# Patient Record
Sex: Female | Born: 1990 | Race: White | Hispanic: No | Marital: Married | Smoking: Never smoker
Health system: Southern US, Community
[De-identification: ages and names within clinical notes are randomized; demographics above are authoritative.]

## PROBLEM LIST (undated history)

## (undated) DIAGNOSIS — F419 Anxiety disorder, unspecified: Secondary | ICD-10-CM

## (undated) HISTORY — DX: Anxiety disorder, unspecified: F41.9

---

## 2017-11-25 DIAGNOSIS — Z1389 Encounter for screening for other disorder: Secondary | ICD-10-CM | POA: Diagnosis not present

## 2017-12-15 HISTORY — PX: WISDOM TOOTH EXTRACTION: SHX21

## 2018-12-13 DIAGNOSIS — R82998 Other abnormal findings in urine: Secondary | ICD-10-CM | POA: Diagnosis not present

## 2018-12-13 DIAGNOSIS — Z0000 Encounter for general adult medical examination without abnormal findings: Secondary | ICD-10-CM | POA: Diagnosis not present

## 2018-12-17 DIAGNOSIS — Z0000 Encounter for general adult medical examination without abnormal findings: Secondary | ICD-10-CM | POA: Diagnosis not present

## 2018-12-17 DIAGNOSIS — Z1389 Encounter for screening for other disorder: Secondary | ICD-10-CM | POA: Diagnosis not present

## 2019-01-10 DIAGNOSIS — J111 Influenza due to unidentified influenza virus with other respiratory manifestations: Secondary | ICD-10-CM | POA: Diagnosis not present

## 2019-02-09 DIAGNOSIS — Z124 Encounter for screening for malignant neoplasm of cervix: Secondary | ICD-10-CM | POA: Diagnosis not present

## 2019-02-09 DIAGNOSIS — Z01419 Encounter for gynecological examination (general) (routine) without abnormal findings: Secondary | ICD-10-CM | POA: Diagnosis not present

## 2020-09-12 LAB — OB RESULTS CONSOLE GC/CHLAMYDIA
Chlamydia: NEGATIVE
Gonorrhea: NEGATIVE

## 2020-10-12 LAB — OB RESULTS CONSOLE HEPATITIS B SURFACE ANTIGEN: Hepatitis B Surface Ag: NEGATIVE

## 2020-10-12 LAB — OB RESULTS CONSOLE RUBELLA ANTIBODY, IGM: Rubella: IMMUNE

## 2020-10-12 LAB — OB RESULTS CONSOLE ABO/RH: RH Type: POSITIVE

## 2020-10-12 LAB — OB RESULTS CONSOLE RPR: RPR: NONREACTIVE

## 2020-10-12 LAB — OB RESULTS CONSOLE HIV ANTIBODY (ROUTINE TESTING): HIV: NONREACTIVE

## 2020-10-12 LAB — OB RESULTS CONSOLE ANTIBODY SCREEN: Antibody Screen: NEGATIVE

## 2020-12-12 ENCOUNTER — Other Ambulatory Visit: Payer: Self-pay | Admitting: Obstetrics and Gynecology

## 2020-12-12 DIAGNOSIS — Z363 Encounter for antenatal screening for malformations: Secondary | ICD-10-CM

## 2020-12-15 NOTE — L&D Delivery Note (Signed)
Delivery Note Lisa Mckay is a G1P0 at [redacted]w[redacted]d who had a spontaneous delivery at 2328, a viable female was delivered via ROA.  APGAR: 7, 9; weight 7lb12.9oz (3541g)  .    Admitted for induction of labor for gestational hypertension.  Induced with cytotec and then progressed spontaneously. Progressed normally. Received epidural for pain management. Pushed for 2 hours. Baby was delivered without difficulty. Tight nuchal cord reduced with delivery of shoulders.   Delayed cord clamping for 60 seconds. Delivery of placenta was spontaneous. Superficial bilateral vaginal lacerations extending up to bilateral labia majora, repaired with 4-0 vicryl. Excellent hemostasis appreciated.  Fundus firm. Estimated blood loss 250cc. Instrument and gauze counts were correct at the end of the procedure.   Placenta status: to L&D   Anesthesia:  epidural Episiotomy:  none Lacerations:  bilateral vaginal extending to labia majora Suture Repair: 4-0 vicryl Est. Blood Loss (mL):   Mom to postpartum.  Baby to Couplet care / Skin to Skin.  Charlett Nose 05/01/2021, 12:06 AM

## 2020-12-18 ENCOUNTER — Other Ambulatory Visit: Payer: Self-pay | Admitting: *Deleted

## 2020-12-18 ENCOUNTER — Other Ambulatory Visit: Payer: Self-pay

## 2020-12-18 ENCOUNTER — Ambulatory Visit: Payer: Commercial Managed Care - PPO | Attending: Obstetrics and Gynecology

## 2020-12-18 ENCOUNTER — Encounter: Payer: Self-pay | Admitting: *Deleted

## 2020-12-18 ENCOUNTER — Ambulatory Visit: Payer: Commercial Managed Care - PPO | Admitting: *Deleted

## 2020-12-18 VITALS — BP 119/74 | HR 105 | Ht 65.0 in

## 2020-12-18 DIAGNOSIS — Z362 Encounter for other antenatal screening follow-up: Secondary | ICD-10-CM

## 2020-12-18 DIAGNOSIS — O322XX0 Maternal care for transverse and oblique lie, not applicable or unspecified: Secondary | ICD-10-CM

## 2020-12-18 DIAGNOSIS — Z363 Encounter for antenatal screening for malformations: Secondary | ICD-10-CM | POA: Diagnosis not present

## 2020-12-18 DIAGNOSIS — Z3A20 20 weeks gestation of pregnancy: Secondary | ICD-10-CM | POA: Diagnosis not present

## 2021-01-15 ENCOUNTER — Ambulatory Visit: Payer: Commercial Managed Care - PPO | Attending: Obstetrics and Gynecology

## 2021-01-15 ENCOUNTER — Encounter: Payer: Self-pay | Admitting: *Deleted

## 2021-01-15 ENCOUNTER — Other Ambulatory Visit: Payer: Self-pay

## 2021-01-15 ENCOUNTER — Ambulatory Visit: Payer: Commercial Managed Care - PPO | Admitting: *Deleted

## 2021-01-15 VITALS — BP 135/87 | HR 102

## 2021-01-15 DIAGNOSIS — Z362 Encounter for other antenatal screening follow-up: Secondary | ICD-10-CM

## 2021-01-15 DIAGNOSIS — Z3A24 24 weeks gestation of pregnancy: Secondary | ICD-10-CM

## 2021-04-11 LAB — OB RESULTS CONSOLE GBS: GBS: NEGATIVE

## 2021-04-23 ENCOUNTER — Other Ambulatory Visit: Payer: Self-pay

## 2021-04-23 ENCOUNTER — Encounter (HOSPITAL_COMMUNITY): Payer: Self-pay | Admitting: Obstetrics and Gynecology

## 2021-04-23 ENCOUNTER — Inpatient Hospital Stay (HOSPITAL_COMMUNITY)
Admission: AD | Admit: 2021-04-23 | Discharge: 2021-04-23 | Disposition: A | Discharge status: Home / Self Care | Payer: Commercial Managed Care - PPO | Attending: Obstetrics and Gynecology | Admitting: Obstetrics and Gynecology

## 2021-04-23 DIAGNOSIS — O26893 Other specified pregnancy related conditions, third trimester: Secondary | ICD-10-CM

## 2021-04-23 DIAGNOSIS — Z7982 Long term (current) use of aspirin: Secondary | ICD-10-CM | POA: Diagnosis not present

## 2021-04-23 DIAGNOSIS — Z3A38 38 weeks gestation of pregnancy: Secondary | ICD-10-CM | POA: Insufficient documentation

## 2021-04-23 DIAGNOSIS — Z3483 Encounter for supervision of other normal pregnancy, third trimester: Secondary | ICD-10-CM | POA: Diagnosis not present

## 2021-04-23 DIAGNOSIS — N898 Other specified noninflammatory disorders of vagina: Secondary | ICD-10-CM | POA: Diagnosis not present

## 2021-04-23 DIAGNOSIS — Z0371 Encounter for suspected problem with amniotic cavity and membrane ruled out: Secondary | ICD-10-CM | POA: Diagnosis present

## 2021-04-23 LAB — WET PREP, GENITAL
Clue Cells Wet Prep HPF POC: NONE SEEN
Sperm: NONE SEEN
Trich, Wet Prep: NONE SEEN
Yeast Wet Prep HPF POC: NONE SEEN

## 2021-04-23 LAB — POCT FERN TEST: POCT Fern Test: NEGATIVE

## 2021-04-23 NOTE — Discharge Instructions (Signed)

## 2021-04-23 NOTE — MAU Note (Signed)
Presents with c/o LOF since 1030 this morning, fluid clear per report.  Denies VB.  Endorses +FM.

## 2021-04-23 NOTE — MAU Provider Note (Signed)
  History     CSN: [id number]  Arrival date and time: 04/23/21 1543   None     Chief Complaint  Patient presents with  . Rupture of Membranes   HPI Lisa Mckay is a 29yo G1 at 38.1wks who presents for eval of vag d/c and questionable leaking. Has noticed this just today; no bleeding, ctx, or pain. Her preg has been followed by [facility] and has been essentially unremarkable.  OB History    Gravida  1   Para      Term      Preterm      AB      Living        SAB      IAB      Ectopic      Multiple      Live Births              Past Medical History:  Diagnosis Date  . Anxiety     Past Surgical History:  Procedure Laterality Date  . WISDOM TOOTH EXTRACTION N/A 2019    Family History  Problem Relation Age of Onset  . Hypertension Father   . Learning disabilities Brother     Social History   Tobacco Use  . Smoking status: Never Smoker  . Smokeless tobacco: Never Used  Vaping Use  . Vaping Use: Never used  Substance Use Topics  . Alcohol use: Not Currently  . Drug use: Never    Allergies: No Known Allergies  Medications Prior to Admission  Medication Sig Dispense Refill Last Dose  . aspirin EC 81 MG tablet Take 81 mg by mouth daily. Swallow whole.   04/23/2021 at Unknown time  . Prenatal Vit-Fe Fumarate-FA (PRENATAL MULTIVITAMIN) TABS tablet Take 1 tablet by mouth daily at 12 noon.       Review of Systems No other pertinents other than what is listed in HPI Physical Exam   Blood pressure 117/81, pulse 100, temperature 98.3 F (36.8 C), temperature source Oral, resp. rate 20, height 5\' 5"  (1.651 m), weight 98.4 kg, last menstrual period 07/24/2020, SpO2 98 %.  Physical Exam Constitutional:      Appearance: Normal appearance.  HENT:     Head: Normocephalic.     Mouth/Throat:     Mouth: Mucous membranes are moist.  Cardiovascular:     Rate and Rhythm: Normal rate.  Pulmonary:     Effort: Pulmonary effort is normal.  Abdominal:      Comments: EFM 145-150s, +accels, no decels, Cat 1 No ctx  Genitourinary:    Comments: SSE: white vag d/c, neg pool Cx deferred Musculoskeletal:     Cervical back: Normal range of motion.  Neurological:     Mental Status: She is alert.    Fern: neg  Microscopic wet-mount exam shows negative for pathogens, normal epithelial cells, white blood cells.   MAU Course  Procedures  MDM SSE, NST, fern  Assessment and Plan  IUP@38 .1wks Vag d/c in preg  D/C home with labor/leaking/bleeding precautions Keep next sched visit at [facility] on 04/25/10 or come to MAU sooner prn  06/25/10 CNM 04/23/2021, 4:40 PM

## 2021-04-25 ENCOUNTER — Other Ambulatory Visit: Payer: Self-pay

## 2021-04-25 ENCOUNTER — Encounter (HOSPITAL_COMMUNITY): Payer: Self-pay | Admitting: Obstetrics & Gynecology

## 2021-04-25 ENCOUNTER — Inpatient Hospital Stay (HOSPITAL_COMMUNITY)
Admission: AD | Admit: 2021-04-25 | Discharge: 2021-04-25 | Disposition: A | Discharge status: Home / Self Care | Payer: Commercial Managed Care - PPO | Attending: Obstetrics & Gynecology | Admitting: Obstetrics & Gynecology

## 2021-04-25 DIAGNOSIS — O26893 Other specified pregnancy related conditions, third trimester: Secondary | ICD-10-CM | POA: Diagnosis not present

## 2021-04-25 DIAGNOSIS — Z7982 Long term (current) use of aspirin: Secondary | ICD-10-CM | POA: Diagnosis not present

## 2021-04-25 DIAGNOSIS — Z3A38 38 weeks gestation of pregnancy: Secondary | ICD-10-CM | POA: Insufficient documentation

## 2021-04-25 DIAGNOSIS — R030 Elevated blood-pressure reading, without diagnosis of hypertension: Secondary | ICD-10-CM | POA: Diagnosis not present

## 2021-04-25 DIAGNOSIS — O99891 Other specified diseases and conditions complicating pregnancy: Secondary | ICD-10-CM | POA: Diagnosis not present

## 2021-04-25 DIAGNOSIS — Z8249 Family history of ischemic heart disease and other diseases of the circulatory system: Secondary | ICD-10-CM | POA: Insufficient documentation

## 2021-04-25 LAB — URINALYSIS, ROUTINE W REFLEX MICROSCOPIC
Bilirubin Urine: NEGATIVE
Glucose, UA: NEGATIVE mg/dL
Ketones, ur: NEGATIVE mg/dL
Nitrite: NEGATIVE
Protein, ur: NEGATIVE mg/dL
Specific Gravity, Urine: 1.015 (ref 1.005–1.030)
pH: 6 (ref 5.0–8.0)

## 2021-04-25 LAB — COMPREHENSIVE METABOLIC PANEL
ALT: 22 U/L (ref 0–44)
AST: 24 U/L (ref 15–41)
Albumin: 2.4 g/dL — ABNORMAL LOW (ref 3.5–5.0)
Alkaline Phosphatase: 169 U/L — ABNORMAL HIGH (ref 38–126)
Anion gap: 8 (ref 5–15)
BUN: 7 mg/dL (ref 6–20)
CO2: 23 mmol/L (ref 22–32)
Calcium: 8.5 mg/dL — ABNORMAL LOW (ref 8.9–10.3)
Chloride: 103 mmol/L (ref 98–111)
Creatinine, Ser: 0.7 mg/dL (ref 0.44–1.00)
GFR, Estimated: >60 mL/min (ref >60)
Glucose, Bld: 80 mg/dL (ref 70–99)
Potassium: 4 mmol/L (ref 3.5–5.1)
Sodium: 134 mmol/L — ABNORMAL LOW (ref 135–145)
Total Bilirubin: 0.6 mg/dL (ref 0.3–1.2)
Total Protein: 6.1 g/dL — ABNORMAL LOW (ref 6.5–8.1)

## 2021-04-25 LAB — CBC
HCT: 39.9 % (ref 36.0–46.0)
Hemoglobin: 13.3 g/dL (ref 12.0–15.0)
MCH: 29.7 pg (ref 26.0–34.0)
MCHC: 33.3 g/dL (ref 30.0–36.0)
MCV: 89.1 fL (ref 80.0–100.0)
Platelets: 249 10*3/uL (ref 150–400)
RBC: 4.48 MIL/uL (ref 3.87–5.11)
RDW: 13.4 % (ref 11.5–15.5)
WBC: 11.6 10*3/uL — ABNORMAL HIGH (ref 4.0–10.5)
nRBC: 0 % (ref 0.0–0.2)

## 2021-04-25 LAB — PROTEIN / CREATININE RATIO, URINE
Creatinine, Urine: 92.32 mg/dL
Protein Creatinine Ratio: 0.11 mg/mg{Cre} (ref 0.00–0.15)
Total Protein, Urine: 10 mg/dL

## 2021-04-25 NOTE — Progress Notes (Signed)
OK to d/c EFM per Sharen Counter CNM. CNM also stopped cycling b/p earlier

## 2021-04-25 NOTE — MAU Note (Signed)
Was seen in office today and b/p was elevated. Was told to come to hospital for evaluation. Pt states she has had a hectic day. Today is first day of HTN. Denies any h/a, epigastric pain, visual changes. FT today in office. No pain

## 2021-04-25 NOTE — Progress Notes (Signed)
Written and verbal d/c instructions given and understanding voiced. Will call office in AM for B/P check tomorrow.

## 2021-04-25 NOTE — MAU Provider Note (Addendum)
History     CSN: [id number]  Arrival date and time: 04/25/21 2022   Event Date/Time   First Provider Initiated Contact with Patient 04/25/21 2054      Chief Complaint  Patient presents with  . Hypertension   HPI Lisa Mckay is a 30 y.o. G1P0 at [redacted]w[redacted]d who presents for evaluation of high blood pressure. Patient was in the office this afternoon & had 2 elevated BPs. Denies history of hypertension & this was first elevation during her pregnancy. Was told to come here for evaluation but was unable to get her before now. Denies headache, visual disturbance, or epigastric pain. Reports good fetal movement.   OB History    Gravida  1   Para      Term      Preterm      AB      Living        SAB      IAB      Ectopic      Multiple      Live Births              Past Medical History:  Diagnosis Date  . Anxiety     Past Surgical History:  Procedure Laterality Date  . WISDOM TOOTH EXTRACTION N/A 2019    Family History  Problem Relation Age of Onset  . Hypertension Father   . Learning disabilities Brother     Social History   Tobacco Use  . Smoking status: Never Smoker  . Smokeless tobacco: Never Used  Vaping Use  . Vaping Use: Never used  Substance Use Topics  . Alcohol use: Not Currently  . Drug use: Never    Allergies: No Known Allergies  Medications Prior to Admission  Medication Sig Dispense Refill Last Dose  . aspirin EC 81 MG tablet Take 81 mg by mouth daily. Swallow whole.     . Prenatal Vit-Fe Fumarate-FA (PRENATAL MULTIVITAMIN) TABS tablet Take 1 tablet by mouth daily at 12 noon.       Review of Systems  Eyes: Negative for visual disturbance.  Gastrointestinal: Negative.   Neurological: Negative for headaches.   Physical Exam   Blood pressure 129/84, pulse 96, temperature 98.1 F (36.7 C), resp. rate 18, height 5\' 5"  (1.651 m), weight 98.4 kg, last menstrual period 07/24/2020.  Physical Exam Vitals and nursing note reviewed.   Constitutional:      Appearance: Normal appearance.  HENT:     Head: Normocephalic and atraumatic.  Eyes:     General: No scleral icterus. Cardiovascular:     Rate and Rhythm: Normal rate and regular rhythm.     Heart sounds: Normal heart sounds.  Pulmonary:     Effort: Pulmonary effort is normal. No respiratory distress.  Skin:    General: Skin is warm and dry.  Neurological:     Mental Status: She is alert.  Psychiatric:        Mood and Affect: Mood normal.        Behavior: Behavior normal.     MAU Course  Procedures  MDM Preeclampsia labs ordered due to elevated BPs this afternoon per Care Everywhere.   Care turned over to [facility], NP 04/25/2021 8:56 PM   Results for orders placed or performed during the hospital encounter of 04/25/21 (from the past 24 hour(s))  Urinalysis, Routine w reflex microscopic Urine, Clean Catch     Status: Abnormal   Collection Time: 04/25/21  9:03 PM  Result Value Ref Range   Color, Urine YELLOW YELLOW   APPearance CLEAR CLEAR   Specific Gravity, Urine 1.015 1.005 - 1.030   pH 6.0 5.0 - 8.0   Glucose, UA NEGATIVE NEGATIVE mg/dL   Hgb urine dipstick LARGE (A) NEGATIVE   Bilirubin Urine NEGATIVE NEGATIVE   Ketones, ur NEGATIVE NEGATIVE mg/dL   Protein, ur NEGATIVE NEGATIVE mg/dL   Nitrite NEGATIVE NEGATIVE   Leukocytes,Ua TRACE (A) NEGATIVE   RBC / HPF 0-5 0 - 5 RBC/hpf   WBC, UA 0-5 0 - 5 WBC/hpf   Bacteria, UA RARE (A) NONE SEEN   Squamous Epithelial / LPF 0-5 0 - 5   Mucus PRESENT   Protein / creatinine ratio, urine     Status: None   Collection Time: 04/25/21  9:03 PM  Result Value Ref Range   Creatinine, Urine 92.32 mg/dL   Total Protein, Urine 10 mg/dL   Protein Creatinine Ratio 0.11 0.00 - 0.15 mg/mg[Cre]  CBC     Status: Abnormal   Collection Time: 04/25/21  9:24 PM  Result Value Ref Range   WBC 11.6 (H) 4.0 - 10.5 K/uL   RBC 4.48 3.87 - 5.11 MIL/uL   Hemoglobin 13.3 12.0 - 15.0 g/dL    HCT 60.1 09.3 - 23.5 %   MCV 89.1 80.0 - 100.0 fL   MCH 29.7 26.0 - 34.0 pg   MCHC 33.3 30.0 - 36.0 g/dL   RDW 57.3 22.0 - 25.4 %   Platelets 249 150 - 400 K/uL   nRBC 0.0 0.0 - 0.2 %  Comprehensive metabolic panel     Status: Abnormal   Collection Time: 04/25/21  9:24 PM  Result Value Ref Range   Sodium 134 (L) 135 - 145 mmol/L   Potassium 4.0 3.5 - 5.1 mmol/L   Chloride 103 98 - 111 mmol/L   CO2 23 22 - 32 mmol/L   Glucose, Bld 80 70 - 99 mg/dL   BUN 7 6 - 20 mg/dL   Creatinine, Ser 2.70 0.44 - 1.00 mg/dL   Calcium 8.5 (L) 8.9 - 10.3 mg/dL   Total Protein 6.1 (L) 6.5 - 8.1 g/dL   Albumin 2.4 (L) 3.5 - 5.0 g/dL   AST 24 15 - 41 U/L   ALT 22 0 - 44 U/L   Alkaline Phosphatase 169 (H) 38 - 126 U/L   Total Bilirubin 0.6 0.3 - 1.2 mg/dL   GFR, Estimated >62 >37 mL/min   Anion gap 8 5 - 15    MDM:  Pt normotensive in MAU, no s/sx of PEC, and PEC labwork wnl with P/C ratio of 0.11.  NST reactive today.  Pt to f/u in office tomorrow, 04/26/21, for repeat BP. Warning signs/signs of PEC/reasons to return to MAU reviewed.  Assessment and Plan   1. Elevated blood-pressure reading without diagnosis of hypertension   2. [redacted] weeks gestation of pregnancy     D/C home with PEC precautions Close outpatient F/U for BP check  Sharen Counter, CNM 10:55 PM   .

## 2021-04-29 ENCOUNTER — Inpatient Hospital Stay (HOSPITAL_COMMUNITY)
Admission: AD | Admit: 2021-04-29 | Discharge: 2021-05-02 | DRG: 807 | Disposition: A | Discharge status: Home / Self Care | Payer: Commercial Managed Care - PPO | Attending: Obstetrics and Gynecology | Admitting: Obstetrics and Gynecology

## 2021-04-29 ENCOUNTER — Other Ambulatory Visit: Payer: Self-pay

## 2021-04-29 ENCOUNTER — Encounter (HOSPITAL_COMMUNITY): Payer: Self-pay | Admitting: Obstetrics and Gynecology

## 2021-04-29 DIAGNOSIS — Z20822 Contact with and (suspected) exposure to covid-19: Secondary | ICD-10-CM | POA: Diagnosis present

## 2021-04-29 DIAGNOSIS — Z3A39 39 weeks gestation of pregnancy: Secondary | ICD-10-CM | POA: Diagnosis not present

## 2021-04-29 DIAGNOSIS — O134 Gestational [pregnancy-induced] hypertension without significant proteinuria, complicating childbirth: Secondary | ICD-10-CM | POA: Diagnosis present

## 2021-04-29 DIAGNOSIS — Z7982 Long term (current) use of aspirin: Secondary | ICD-10-CM | POA: Diagnosis not present

## 2021-04-29 DIAGNOSIS — Z3490 Encounter for supervision of normal pregnancy, unspecified, unspecified trimester: Secondary | ICD-10-CM

## 2021-04-29 LAB — CBC
HCT: 41 % (ref 36.0–46.0)
Hemoglobin: 14.1 g/dL (ref 12.0–15.0)
MCH: 30.5 pg (ref 26.0–34.0)
MCHC: 34.4 g/dL (ref 30.0–36.0)
MCV: 88.6 fL (ref 80.0–100.0)
Platelets: 238 10*3/uL (ref 150–400)
RBC: 4.63 MIL/uL (ref 3.87–5.11)
RDW: 13.7 % (ref 11.5–15.5)
WBC: 12.1 10*3/uL — ABNORMAL HIGH (ref 4.0–10.5)
nRBC: 0 % (ref 0.0–0.2)

## 2021-04-29 LAB — COMPREHENSIVE METABOLIC PANEL
ALT: 20 U/L (ref 0–44)
AST: 23 U/L (ref 15–41)
Albumin: 2.4 g/dL — ABNORMAL LOW (ref 3.5–5.0)
Alkaline Phosphatase: 180 U/L — ABNORMAL HIGH (ref 38–126)
Anion gap: 11 (ref 5–15)
BUN: 8 mg/dL (ref 6–20)
CO2: 20 mmol/L — ABNORMAL LOW (ref 22–32)
Calcium: 8.6 mg/dL — ABNORMAL LOW (ref 8.9–10.3)
Chloride: 106 mmol/L (ref 98–111)
Creatinine, Ser: 0.65 mg/dL (ref 0.44–1.00)
GFR, Estimated: >60 mL/min (ref >60)
Glucose, Bld: 96 mg/dL (ref 70–99)
Potassium: 4.2 mmol/L (ref 3.5–5.1)
Sodium: 137 mmol/L (ref 135–145)
Total Bilirubin: 0.6 mg/dL (ref 0.3–1.2)
Total Protein: 5.6 g/dL — ABNORMAL LOW (ref 6.5–8.1)

## 2021-04-29 LAB — RESP PANEL BY RT-PCR (FLU A&B, COVID) ARPGX2
Influenza A by PCR: NEGATIVE
Influenza B by PCR: NEGATIVE
SARS Coronavirus 2 by RT PCR: NEGATIVE

## 2021-04-29 LAB — TYPE AND SCREEN
ABO/RH(D): A POS
Antibody Screen: NEGATIVE

## 2021-04-29 MED ORDER — ONDANSETRON HCL 4 MG/2ML IJ SOLN
4.0000 mg | Freq: Four times a day (QID) | INTRAMUSCULAR | Status: DC | PRN
  Administered 2021-04-30: 4 mg via INTRAVENOUS
  Filled 2021-04-29: qty 2

## 2021-04-29 MED ORDER — OXYCODONE-ACETAMINOPHEN 5-325 MG PO TABS: 2.0000 | ORAL_TABLET | ORAL | Status: DC | PRN

## 2021-04-29 MED ORDER — OXYTOCIN BOLUS FROM INFUSION
333.0000 mL | Freq: Once | INTRAVENOUS | Status: AC
  Administered 2021-04-30: 333 mL via INTRAVENOUS

## 2021-04-29 MED ORDER — SOD CITRATE-CITRIC ACID 500-334 MG/5ML PO SOLN: 30.0000 mL | ORAL | Status: DC | PRN

## 2021-04-29 MED ORDER — MISOPROSTOL 25 MCG QUARTER TABLET
25.0000 ug | ORAL_TABLET | ORAL | Status: DC | PRN
  Administered 2021-04-29 – 2021-04-30 (×3): 25 ug via VAGINAL
  Filled 2021-04-29 (×4): qty 1

## 2021-04-29 MED ORDER — LACTATED RINGERS IV SOLN: INTRAVENOUS | Status: DC

## 2021-04-29 MED ORDER — LIDOCAINE HCL (PF) 1 % IJ SOLN: 30.0000 mL | INTRAMUSCULAR | Status: DC | PRN

## 2021-04-29 MED ORDER — ACETAMINOPHEN 325 MG PO TABS
650.0000 mg | ORAL_TABLET | ORAL | Status: DC | PRN
  Administered 2021-04-30: 650 mg via ORAL
  Filled 2021-04-29: qty 2

## 2021-04-29 MED ORDER — OXYCODONE-ACETAMINOPHEN 5-325 MG PO TABS
1.0000 | ORAL_TABLET | ORAL | Status: DC | PRN
Start: 2021-04-29 — End: 2021-05-01

## 2021-04-29 MED ORDER — TERBUTALINE SULFATE 1 MG/ML IJ SOLN: 0.2500 mg | Freq: Once | INTRAMUSCULAR | Status: DC | PRN

## 2021-04-29 MED ORDER — LACTATED RINGERS IV SOLN
500.0000 mL | INTRAVENOUS | Status: DC | PRN
  Administered 2021-04-30: 500 mL via INTRAVENOUS

## 2021-04-29 MED ORDER — FENTANYL CITRATE (PF) 100 MCG/2ML IJ SOLN: 50.0000 ug | INTRAMUSCULAR | Status: DC | PRN

## 2021-04-29 MED ORDER — OXYTOCIN-SODIUM CHLORIDE 30-0.9 UT/500ML-% IV SOLN: 2.5000 [IU]/h | INTRAVENOUS | Status: DC

## 2021-04-29 NOTE — H&P (Signed)
30 y.o. [redacted]w[redacted]d  G1P0 comes in from office for direct admission for elevated blood pressure meeting criteria for GHTN.  She denies HA, vision change, RUQ or epigastric pain.  Otherwise has good fetal movement and no bleeding.  Past Medical History:  Diagnosis Date  . Anxiety     Past Surgical History:  Procedure Laterality Date  . WISDOM TOOTH EXTRACTION N/A 2019    OB History  Gravida Para Term Preterm AB Living  1            SAB IAB Ectopic Multiple Live Births               # Outcome Date GA Lbr Len/2nd Weight Sex Delivery Anes PTL Lv  1 Current             Social History   Socioeconomic History  . Marital status: Married    Spouse name: Not on file  . Number of children: Not on file  . Years of education: Not on file  . Highest education level: Not on file  Occupational History  . Not on file  Tobacco Use  . Smoking status: Never Smoker  . Smokeless tobacco: Never Used  Vaping Use  . Vaping Use: Never used  Substance and Sexual Activity  . Alcohol use: Not Currently  . Drug use: Never  . Sexual activity: Yes  Other Topics Concern  . Not on file  Social History Narrative  . Not on file   Social Determinants of Health   Financial Resource Strain: Not on file  Food Insecurity: Not on file  Transportation Needs: Not on file  Physical Activity: Not on file  Stress: Not on file  Social Connections: Not on file  Intimate Partner Violence: Not on file   Patient has no known allergies.    Prenatal Transfer Tool  Maternal Diabetes: No Genetic Screening: Normal Maternal Ultrasounds/Referrals: Normal Fetal Ultrasounds or other Referrals:  None Maternal Substance Abuse:  No Significant Maternal Medications:  Meds include: Other: aspirin 81mg  daily Significant Maternal Lab Results: Group B Strep negative  Other PNC: uncomplicated.    Vitals:   04/29/21 1804 04/29/21 1838 04/29/21 1931 04/29/21 2001  BP: 127/72 116/79 114/65 (!) 105/54  Pulse: 100 (!) 101 91  92  Resp: 18 16 16 18   Temp:  99.1 F (37.3 C)    TempSrc:  Oral    Weight:      Height:        Lungs/Cor:  NAD Abdomen:  soft, gravid Ex:  no cords, erythema SVE:  0.5/thichk/-3 FHTs:  150, good STV, NST R; Cat 1 tracing. Toco:  q q2-4   A/P   Admitted at term for IOL d/t GHTN PEC labs wnl BPs normal since arriva cytotec for cervical ripening, anticipate AROM pitocin  GBS Neg Other routine care.  05/01/21

## 2021-04-30 ENCOUNTER — Inpatient Hospital Stay (HOSPITAL_COMMUNITY): Payer: Commercial Managed Care - PPO | Admitting: Anesthesiology

## 2021-04-30 LAB — RPR: RPR Ser Ql: NONREACTIVE

## 2021-04-30 MED ORDER — EPHEDRINE 5 MG/ML INJ: 10.0000 mg | INTRAVENOUS | Status: DC | PRN

## 2021-04-30 MED ORDER — PHENYLEPHRINE 40 MCG/ML (10ML) SYRINGE FOR IV PUSH (FOR BLOOD PRESSURE SUPPORT)
80.0000 ug | PREFILLED_SYRINGE | INTRAVENOUS | Status: DC | PRN
  Administered 2021-04-30: 80 ug via INTRAVENOUS
  Filled 2021-04-30: qty 10

## 2021-04-30 MED ORDER — FENTANYL-BUPIVACAINE-NACL 0.5-0.125-0.9 MG/250ML-% EP SOLN
12.0000 mL/h | EPIDURAL | Status: DC | PRN
Start: 2021-04-30 — End: 2021-05-01
  Administered 2021-04-30: 12 mL/h via EPIDURAL
  Filled 2021-04-30: qty 250

## 2021-04-30 MED ORDER — OXYTOCIN-SODIUM CHLORIDE 30-0.9 UT/500ML-% IV SOLN
1.0000 m[IU]/min | INTRAVENOUS | Status: DC
  Administered 2021-04-30: 2 m[IU]/min via INTRAVENOUS
  Filled 2021-04-30: qty 500

## 2021-04-30 MED ORDER — LIDOCAINE-EPINEPHRINE (PF) 2 %-1:200000 IJ SOLN
INTRAMUSCULAR | Status: DC | PRN
  Administered 2021-04-30: 5 mL via EPIDURAL

## 2021-04-30 MED ORDER — DIPHENHYDRAMINE HCL 50 MG/ML IJ SOLN: 12.5000 mg | INTRAMUSCULAR | Status: DC | PRN

## 2021-04-30 MED ORDER — PHENYLEPHRINE 40 MCG/ML (10ML) SYRINGE FOR IV PUSH (FOR BLOOD PRESSURE SUPPORT): 80.0000 ug | PREFILLED_SYRINGE | INTRAVENOUS | Status: DC | PRN

## 2021-04-30 MED ORDER — LACTATED RINGERS IV SOLN
500.0000 mL | Freq: Once | INTRAVENOUS | Status: AC
  Administered 2021-04-30: 500 mL via INTRAVENOUS

## 2021-04-30 NOTE — Anesthesia Preprocedure Evaluation (Signed)
Anesthesia Evaluation  Patient identified by MRN, date of birth, ID band Patient awake    Reviewed: Allergy & Precautions, NPO status , Patient's Chart, lab work & pertinent test results  Airway Mallampati: III  TM Distance: >3 FB Neck ROM: Full    Dental no notable dental hx.    Pulmonary neg pulmonary ROS,    Pulmonary exam normal breath sounds clear to auscultation       Cardiovascular hypertension (gHTN), Normal cardiovascular exam Rhythm:Regular Rate:Normal     Neuro/Psych PSYCHIATRIC DISORDERS Anxiety negative neurological ROS     GI/Hepatic negative GI ROS, Neg liver ROS,   Endo/Other  negative endocrine ROS  Renal/GU negative Renal ROS  negative genitourinary   Musculoskeletal negative musculoskeletal ROS (+)   Abdominal   Peds  Hematology negative hematology ROS (+)   Anesthesia Other Findings IOL for gHTN  Reproductive/Obstetrics (+) Pregnancy                             Anesthesia Physical Anesthesia Plan  ASA: III  Anesthesia Plan: Epidural   Post-op Pain Management:    Induction:   PONV Risk Score and Plan: Treatment may vary due to age or medical condition  Airway Management Planned: Natural Airway  Additional Equipment:   Intra-op Plan:   Post-operative Plan:   Informed Consent: I have reviewed the patients History and Physical, chart, labs and discussed the procedure including the risks, benefits and alternatives for the proposed anesthesia with the patient or authorized representative who has indicated his/her understanding and acceptance.       Plan Discussed with: Anesthesiologist  Anesthesia Plan Comments: (Patient identified. Risks, benefits, options discussed with patient including but not limited to bleeding, infection, nerve damage, paralysis, failed block, incomplete pain control, headache, blood pressure changes, nausea, vomiting, reactions to  medication, itching, and post partum back pain. Confirmed with bedside nurse the patient's most recent platelet count. Confirmed with the patient that they are not taking any anticoagulation, have any bleeding history or any family history of bleeding disorders. Patient expressed understanding and wishes to proceed. All questions were answered. )        Anesthesia Quick Evaluation

## 2021-04-30 NOTE — Progress Notes (Addendum)
OB Progress Note  Notified by RN of run of recurrent late decels that resolved with position change to high fowlers and IV fluid bolus, as well as maternal T 100.19F.   S: Patient feeling mild intermittent rectal pressure when sitting up   O: Today's Vitals   04/30/21 1838 04/30/21 1901 04/30/21 1930 04/30/21 2000  BP: 113/61 117/65 117/66 120/62  Pulse: 98 89 95 88  Resp: 18 16 16 18   Temp: 99.3 F (37.4 C)   (!) 100.7 F (38.2 C)  TempSrc: Axillary   Oral  Weight:      Height:      PainSc: 0-No pain 0-No pain 0-No pain    Body mass index is 36.78 kg/m.  SVE 10/100/0  FHR: 155bpm, moderate variability, + accels, late decels, now resolved s/p resuscitative measures Toco: ctx q 2-3 min   A/P:29Y G1P0 @ [redacted]w[redacted]d, IOL for gestational HTN 1. Fetal well being: late decelerations resolved with resuscitate measures, now cat I tracing, overall reassuring 2. IOL: s/p cytotec x 3, pitocin started briefly but turned off earlier today due to decelerations. She has continued to contract spontaneously and is now fully dilated. Will allow short time for passive descent/fetal heart rate recovery and then start pushing. Anticipate SVD.  3. Gestational HTN: normotensive since admission, normal labs, asymptomatic 4. Pain control: epidural 5. Elevated maternal temp x 1: not meeting chorioamnionitis diagnostic criteria at this point, will continue to monitor closely. Low threshold to start antibiotics if persistent elevated temperature.   [redacted]w[redacted]d, MD 04/30/21 8:34 PM

## 2021-04-30 NOTE — Progress Notes (Signed)
OB Progress Note  S:Feeling mild cramping with contractions. No headache, vision changes, RUQ pain   O: Today's Vitals   04/30/21 0701 04/30/21 0710 04/30/21 0731 04/30/21 0748  BP: 125/75  (!) 113/57   Pulse: 86  81   Resp:      Temp:  98 F (36.7 C)    TempSrc:  Oral    Weight:      Height:      PainSc:    1    Body mass index is 36.78 kg/m.  SVE: 2/50/-2, SROM clear fluid with exam  FHR: 155bpm, moderate variability, + accels, small variable decels that resolve with position change/resuscitative measures Toco: ctx q 2-3 mins   A/P: 29Y G1P0 @ [redacted]w[redacted]d, IOL for gestational HTN 1. Fetal well being: cat 1-2 tracing, runs of small variables that resolve with position change/IV fluids, overall reassuring 2. IOL: s/p cytotec x 3, had planned foley balloon but SROM occurred with exam. Given regular contractions on toco as well as episode of cat 2 tracing, will discontinue cytotec and start low dose pitocin.  3. Gestational HTN: normotensive since admission, normal labs, asymptomatic 4. Pain control: epidural upon patient request  M. Timothy Lasso, MD 04/30/21 10:10 AM

## 2021-04-30 NOTE — Progress Notes (Signed)
OB Progress Note  Into room for run of deepening late/variable decelerations. Patient nauseas with low BP 97/62, 101/57, given phenylephrine just prior to my arrival in room.   S: Patient comfortable with epidural   O: Today's Vitals   04/30/21 1331 04/30/21 1401 04/30/21 1417 04/30/21 1418  BP: (!) 101/57 97/62 107/70 107/70  Pulse: 82 76 72 72  Resp:      Temp:      TempSrc:      Weight:      Height:      PainSc:       Body mass index is 36.78 kg/m.  SVE 4/80/-2, IUPC placed  FHR: 150bpm, moderate variability, + accels, late/variable decels Toco: ctx q 2-3 min, difficult to trace   A/P: 29Y G1P0 @ [redacted]w[redacted]d, IOL for gestational HTN 1. Fetal well being: variable/late decelerations resolved after phenylephrine administration/pitocin discontinued/left lateral position, now cat I tracing.  2. IOL: s/p cytotec x 3, was on pitocin 7mu/min. pitocin turned off due to decelerations. IUPC placed to better evaluate contractions and nature of decelerations, currently contracting regularly and making cervical change. Will consider restarting pit after 30 mins of resolved tracing, if needed for contraction augmentation.  - Discussed status with patient. Discussed FHR is responding to resuscitative measures and okay to proceed. If there is a point when the FHR is not responding to resuscitative measures, would consider cesarean. Patient states understanding.  3. Gestational HTN: normotensive since admission, normal labs, asymptomatic 4. Pain control: epidural  M. Timothy Lasso, MD 04/30/21 2:35 PM

## 2021-04-30 NOTE — Anesthesia Procedure Notes (Signed)
Epidural Patient location during procedure: OB Start time: 04/30/2021 11:10 AM End time: 04/30/2021 11:25 AM  Staffing Anesthesiologist: Elmer Picker, MD Performed: anesthesiologist   Preanesthetic Checklist Completed: patient identified, IV checked, risks and benefits discussed, monitors and equipment checked, pre-op evaluation and timeout performed  Epidural Patient position: sitting Prep: DuraPrep and site prepped and draped Patient monitoring: continuous pulse ox, blood pressure, heart rate and cardiac monitor Approach: midline Location: L2-L3 Injection technique: LOR air  Needle:  Needle type: Tuohy  Needle gauge: 17 G Needle length: 9 cm Needle insertion depth: 7 cm Catheter type: closed end flexible Catheter size: 19 Gauge Catheter at skin depth: 12 cm Test dose: negative  Assessment Sensory level: T8 Events: blood not aspirated, injection not painful, no injection resistance, no paresthesia and negative IV test  Additional Notes Patient identified. Risks/Benefits/Options discussed with patient including but not limited to bleeding, infection, nerve damage, paralysis, failed block, incomplete pain control, headache, blood pressure changes, nausea, vomiting, reactions to medication both or allergic, itching and postpartum back pain. Confirmed with bedside nurse the patient's most recent platelet count. Confirmed with patient that they are not currently taking any anticoagulation, have any bleeding history or any family history of bleeding disorders. Patient expressed understanding and wished to proceed. All questions were answered. Sterile technique was used throughout the entire procedure. Please see nursing notes for vital signs. Test dose was given through epidural catheter and negative prior to continuing to dose epidural or start infusion. Warning signs of high block given to the patient including shortness of breath, tingling/numbness in hands, complete motor block,  or any concerning symptoms with instructions to call for help. Patient was given instructions on fall risk and not to get out of bed. All questions and concerns addressed with instructions to call with any issues or inadequate analgesia.    First attempt at L3/L4. LOR at 7cm. Catheter inserted without difficulty; however, there was resistance when bolusing the catheter. Catheter pulled back 1cm without change in resistance. Catheter removed without difficulty. Second attempt at L2/L3. LOR at 7cm. Catheter inserted and bolused without difficulty. Reason for block:procedure for pain

## 2021-05-01 ENCOUNTER — Encounter (HOSPITAL_COMMUNITY): Payer: Self-pay | Admitting: Obstetrics and Gynecology

## 2021-05-01 LAB — CBC
HCT: 36.8 % (ref 36.0–46.0)
HCT: 34.8 % — ABNORMAL LOW (ref 36.0–46.0)
Hemoglobin: 12.4 g/dL (ref 12.0–15.0)
Hemoglobin: 11.7 g/dL — ABNORMAL LOW (ref 12.0–15.0)
MCH: 30.1 pg (ref 26.0–34.0)
MCH: 30.2 pg (ref 26.0–34.0)
MCHC: 33.7 g/dL (ref 30.0–36.0)
MCHC: 33.6 g/dL (ref 30.0–36.0)
MCV: 89.3 fL (ref 80.0–100.0)
MCV: 89.7 fL (ref 80.0–100.0)
Platelets: 201 10*3/uL (ref 150–400)
Platelets: 185 10*3/uL (ref 150–400)
RBC: 4.12 MIL/uL (ref 3.87–5.11)
RBC: 3.88 MIL/uL (ref 3.87–5.11)
RDW: 13.6 % (ref 11.5–15.5)
RDW: 13.9 % (ref 11.5–15.5)
WBC: 17.9 10*3/uL — ABNORMAL HIGH (ref 4.0–10.5)
WBC: 15.3 10*3/uL — ABNORMAL HIGH (ref 4.0–10.5)
nRBC: 0 % (ref 0.0–0.2)
nRBC: 0 % (ref 0.0–0.2)

## 2021-05-01 MED ORDER — COCONUT OIL OIL: 1.0000 "application " | TOPICAL_OIL | Status: DC | PRN

## 2021-05-01 MED ORDER — OXYCODONE HCL 5 MG PO TABS: 5.0000 mg | ORAL_TABLET | ORAL | Status: DC | PRN

## 2021-05-01 MED ORDER — IBUPROFEN 600 MG PO TABS
600.0000 mg | ORAL_TABLET | Freq: Four times a day (QID) | ORAL | Status: DC
  Administered 2021-05-01 – 2021-05-02 (×6): 600 mg via ORAL
  Filled 2021-05-01 (×6): qty 1

## 2021-05-01 MED ORDER — DIBUCAINE (PERIANAL) 1 % EX OINT: 1.0000 "application " | TOPICAL_OINTMENT | CUTANEOUS | Status: DC | PRN

## 2021-05-01 MED ORDER — WITCH HAZEL-GLYCERIN EX PADS: 1.0000 "application " | MEDICATED_PAD | CUTANEOUS | Status: DC | PRN

## 2021-05-01 MED ORDER — OXYCODONE HCL 5 MG PO TABS: 10.0000 mg | ORAL_TABLET | ORAL | Status: DC | PRN

## 2021-05-01 MED ORDER — DOCUSATE SODIUM 100 MG PO CAPS
100.0000 mg | ORAL_CAPSULE | Freq: Two times a day (BID) | ORAL | Status: DC
  Administered 2021-05-02: 100 mg via ORAL
  Filled 2021-05-01: qty 1

## 2021-05-01 MED ORDER — ONDANSETRON HCL 4 MG/2ML IJ SOLN: 4.0000 mg | INTRAMUSCULAR | Status: DC | PRN

## 2021-05-01 MED ORDER — ACETAMINOPHEN 325 MG PO TABS
650.0000 mg | ORAL_TABLET | ORAL | Status: DC | PRN
  Administered 2021-05-01: 650 mg via ORAL
  Filled 2021-05-01: qty 2

## 2021-05-01 MED ORDER — ONDANSETRON HCL 4 MG PO TABS: 4.0000 mg | ORAL_TABLET | ORAL | Status: DC | PRN

## 2021-05-01 MED ORDER — DIPHENHYDRAMINE HCL 25 MG PO CAPS: 25.0000 mg | ORAL_CAPSULE | Freq: Four times a day (QID) | ORAL | Status: DC | PRN

## 2021-05-01 MED ORDER — MAGNESIUM HYDROXIDE 400 MG/5ML PO SUSP: 30.0000 mL | ORAL | Status: DC | PRN

## 2021-05-01 MED ORDER — SIMETHICONE 80 MG PO CHEW
80.0000 mg | CHEWABLE_TABLET | ORAL | Status: DC | PRN
  Filled 2021-05-01: qty 1

## 2021-05-01 MED ORDER — BENZOCAINE-MENTHOL 20-0.5 % EX AERO
1.0000 "application " | INHALATION_SPRAY | CUTANEOUS | Status: DC | PRN
  Filled 2021-05-01: qty 56

## 2021-05-01 MED ORDER — PRENATAL MULTIVITAMIN CH
1.0000 | ORAL_TABLET | Freq: Every day | ORAL | Status: DC
  Administered 2021-05-01 – 2021-05-02 (×2): 1 via ORAL
  Filled 2021-05-01 (×2): qty 1

## 2021-05-01 NOTE — Anesthesia Postprocedure Evaluation (Signed)
Anesthesia Post Note  Patient: Lisa Mckay  Procedure(s) Performed: AN AD HOC LABOR EPIDURAL     Patient location during evaluation: Mother Baby Anesthesia Type: Epidural Level of consciousness: awake Pain management: satisfactory to patient Vital Signs Assessment: post-procedure vital signs reviewed and stable Respiratory status: spontaneous breathing Cardiovascular status: stable Anesthetic complications: no   No complications documented.  Last Vitals:  Vitals:   05/01/21 0300 05/01/21 0647  BP:  123/75  Pulse:  67  Resp:  15  Temp: 37.1 C 37.1 C  SpO2:      Last Pain:  Vitals:   05/01/21 0738  TempSrc:   PainSc: 0-No pain   Pain Goal:                   Cephus Shelling

## 2021-05-01 NOTE — Social Work (Signed)
CSW received consult for hx of Anxiety.  CSW met with MOB to offer support and complete assessment.     CSW introduced self and role. CSW observed FOB in recliner performing skin to skin with infant. MOB declined to have CSW return to speak in private. CSW informed MOB of reason for consult and assessed current emotions. MOB reported she is doing "good." MOB stated she also had a smooth pregnancy. MOB disclosed she was diagnosed with anxiety in 2019 and that she experienced some general anxiety during pregnancy. MOB stated she was started on Lexapro in 2019, which she stopped taking during the pregnancy. MOB reported she found it to be helpful and she has not decided if she will restart the medication postpartum. MOB denies ever attending therapy and was receptive to resources provided. MOB identified FOB, their parents and siblings as supports. MOB denies any current SI or HI.   CSW provided education regarding the baby blues period versus PPD. CSW provided the New Mom Checklist and encouraged MOB to self evaluate and contact a medical professional if symptoms are noted at any time.  CSW provided review of Sudden Infant Death Syndrome (SIDS) precautions.  MOB reported she has a car seat and bassinet for infant. MOB identified [facility] for follow-up care and denies any barriers to care. MOB expressed no additional needs at this time.   CSW identifies no further need for intervention and no barriers to discharge at this time.  Darra Lis, [place] [employer] [phone]

## 2021-05-01 NOTE — Lactation Note (Signed)
This note was copied from a baby's chart. Lactation Consultation Note Baby 5 hrs old. Mom stated baby fed a couple of hours ago. Mom just got back from BR using steady w/RN. Mom stated she was really tired and needed to rest. LC asked mom if I could set up DEBP, mom stated yes. LC set up pump and gave mom shells w/instructions how to wear them.  Mom is flat, breast are heavy w/some edema. Mom has generalized edema. Asked mom to call for feeding.  Lactation brochure given.,  Patient Name: Lisa Mckay UMPNT'I Date: 05/01/2021 Reason for consult: Initial assessment;Primapara;Term Age:49 hours  Maternal Data    Feeding    LATCH Score Latch: Repeated attempts needed to sustain latch, nipple held in mouth throughout feeding, stimulation needed to elicit sucking reflex.  Audible Swallowing: None  Type of Nipple: Flat  Comfort (Breast/Nipple): Filling, red/small blisters or bruises, mild/mod discomfort (very heavy, full feeling,edema)  Hold (Positioning): Assistance needed to correctly position infant at breast and maintain latch.  LATCH Score: 5   Lactation Tools Discussed/Used Tools: Pump;Shells Breast pump type: Double-Electric Breast Pump Pump Education: Other (comment) (set up pump. mom wanted to sleep. didn't show her how to use it.) Reason for Pumping: flat  Interventions Interventions: Breast feeding basics reviewed;DEBP;Skin to skin;Breast massage;Hand express;Shells  Discharge [facility] Program: No  Consult Status Consult Status: Follow-up Date: 05/01/21 Follow-up type: In-patient    Charyl Dancer 05/01/2021, 4:46 AM

## 2021-05-01 NOTE — Lactation Note (Signed)
This note was copied from a baby's chart. Lactation Consultation Note Baby less than hr old at time of consult. Assisted baby to cradle position for latching. Mom's breast are very heavy and full feeling. Mom does have generalized edema. Nipples does has some thickness to them. Finger massage and stimulation softens and everts nipples well for baby tom latch and feel in her mouth. Baby latched, off and on at first, took a little bit to start suckling but finally did. Baby softened breast tissue some especially at nipple area.  Hand expression demonstrated colostrum noted. Mom happy. Will f/u on MBU.  Patient Name: Lisa Mckay Date: 05/01/2021 Reason for consult: L&D Initial assessment;Primapara;Term Age:20 hours  Maternal Data Has patient been taught Hand Expression?: Yes  Feeding    LATCH Score Latch: Grasps breast easily, tongue down, lips flanged, rhythmical sucking.  Audible Swallowing: None  Type of Nipple: Flat  Comfort (Breast/Nipple): Soft / non-tender  Hold (Positioning): Assistance needed to correctly position infant at breast and maintain latch.  LATCH Score: 6   Lactation Tools Discussed/Used    Interventions Interventions: Adjust position;Assisted with latch;Support pillows;Skin to skin;Position options;Breast massage  Discharge    Consult Status Consult Status: Follow-up Date: 05/01/21 Follow-up type: In-patient    Charyl Dancer 05/01/2021, 12:47 AM

## 2021-05-01 NOTE — Lactation Note (Signed)
This note was copied from a baby's chart. Lactation Consultation Note  Patient Name: Lisa Mckay JSHFW'Y Date: 05/01/2021 Reason for consult: Follow-up assessment;Mother's request;Difficult latch;Primapara;1st time breastfeeding Age:28 hours   LC returned to work on latching. Infant has thick labial attachment and high palate. With suck training, she improved with strength of her latch. LC used 20 NS with curve tip and infant latched took 1 ml and transferred with compression from the breasts for about 10 minutes.   Parents consented to use DBM and paced bottle fed infant 10 ml.  Parents provided with comparison sheet on breastfeeding supplementation vs volumes to supplement when infant not latching.  Mom's nipples are flat and she is using breast shells to extend nipples. Mom aware to remove breast shells when she is sleeping, pumping or nursing. Mom can pre pump 5-10 minutes before latching.   Plan 1. To feed based on cues 8-12xin 24 hr period no more than 4 hrs without an attempt. Mom to offer both breasts and use 20 NS to assist with latching primed with DBM with curve tip to initiate a suck. Mom to use breast compression and ensure lips are flanged out with cheeks and nose touching.        2. Mom to pace bottle feed EBM or DBM based on guide provided an hrs of age since birth. Mom to offer more if infant not able to sustain a good latch.         3. Mom to pump on dEBP q 3 hrs for 15 minutes.  LC reviewed feeding plan with RN Royden Purl. Maternal Data Has patient been taught Hand Expression?: Yes  Feeding Mother's Current Feeding Choice: Breast Milk and Donor Milk  LATCH Score Latch: Repeated attempts needed to sustain latch, nipple held in mouth throughout feeding, stimulation needed to elicit sucking reflex.  Audible Swallowing: A few with stimulation  Type of Nipple: Flat  Comfort (Breast/Nipple): Soft / non-tender  Hold (Positioning): Assistance needed to correctly  position infant at breast and maintain latch.  LATCH Score: 6   Lactation Tools Discussed/Used Tools: Pump;Flanges Flange Size: 24 Breast pump type: Double-Electric Breast Pump Pump Education: Setup, frequency, and cleaning;Milk Storage Reason for Pumping: increase stimulation Pumping frequency: every 3 hrs for 15 minutes  Interventions Interventions: Breast feeding basics reviewed;Support pillows;Education;Assisted with latch;Position options;Skin to skin;Expressed milk;Breast massage;Hand express;Breast compression;DEBP;Adjust position  Discharge Pump: Personal  Consult Status Consult Status: Follow-up Date: 05/02/21 Follow-up type: In-patient    Lisa Mckay  Lisa Mckay 05/01/2021, 10:21 PM

## 2021-05-01 NOTE — Progress Notes (Signed)
Patient is doing well.  She is ambulating, voiding, tolerating PO.  Pain control is good.  Lochia is appropriate  Vitals:   05/01/21 0155 05/01/21 0255 05/01/21 0300 05/01/21 0647  BP: 124/62 115/60  123/75  Pulse: (!) 108 96  67  Resp: 16 18  15   Temp: 99.1 F (37.3 C) 99.5 F (37.5 C) 98.7 F (37.1 C) 98.8 F (37.1 C)  TempSrc: Oral Oral Oral Oral  SpO2: 95%     Weight:      Height:        NAD Fundus firm Ext: 1+ edema bilateral, symmetric  Lab Results  Component Value Date   WBC 15.3 (H) 05/01/2021   HGB 11.7 (L) 05/01/2021   HCT 34.8 (L) 05/01/2021   MCV 89.7 05/01/2021   PLT 185 05/01/2021    --/--/A POS (05/16 1755)/RImmune  A/P 29 y.o. G1P1001 PPD#1 s/p SVD at 39w for GHTN. Routine care.   BPs normal, did not require treatment during labor.  Asymptomatic  Expect d/c tomorrow.    [facility]

## 2021-05-01 NOTE — Lactation Note (Addendum)
This note was copied from a baby's chart. Lactation Consultation Note  Patient Name: Girl Moncia Annas QZRAQ'T Date: 05/01/2021   Age:27 hours  LC went in to assist with latching after receiving call twice from front desk. On arrival met with RN,who worked with Mom and told me to check in for next feeding.    LC returned at 20:18 to assess how feeding going but infant get test done at that time. LC to alert RN Mom will need assistance with feeding and discuss use of DBM as option for supplementation.    In flow sheet only 1 stool charted and no urine/.  Maternal Data    Feeding    LATCH Score                    Lactation Tools Discussed/Used    Interventions    Discharge    Consult Status      Giovan Pinsky  Nicholson-Springer 05/01/2021, 5:14 PM

## 2021-05-02 NOTE — Lactation Note (Signed)
This note was copied from a baby's chart. Lactation Consultation Note  Patient Name: Lisa Mckay DGLOV'F Date: 05/02/2021 Reason for consult: Follow-up assessment;Mother's request;Difficult latch;Primapara;1st time breastfeeding;Term Age:28 hours   Infant small volumes with bottle feeding and short intermittent sucks at the breasts, according to parents and feeding chart. LC worked with suck training, given infant high palate and thick labial attachment. Parents bottle feeding infant prior to start of this visit stating infant only able to take 5-10 ml.   Mom pumping twice today able to collect 11 ml with last 2 pumping sessions. LC informed mother with use of NS barrier to milk supply, important continues pumping on personal electric pump (Spectra) q 3hrs for 15 minutes after latching once at home.   Following suck training, infant took 10 ml with slow flow nipple. LC then latched infant at the breast using 20 NS, 5 french feeding tube and syringe gave additional 20 ml. Infant with breast compression would do some intermittent sucks at the breast but would stop without continuous flow of formula. Dad offered additional 3 ml formula  for total 33 ml for this feeding.   LC recommended Mom call Pediatrician office to set up appointment to meet with [facility] tomorrow. LC will also sent email to our outpatient clinic to contact mother if further follow up is needed an unavailable at pediatric office.   Mom has breast shells to wear when she is not pumping, sleeping or nursing. Mom aware to clean parts in between use.   Plan 1. To feed based on cues 8-12x in24 hr period no more than 4 hrs without an attempt. Mom to offer both breasts and look for swallows if needed with aid of 20 NS.             2 Supplementation after each feeding done with 5 french with syringe or paced bottle feeding with slow flow nipple according to breastfeeding supplementation guide and hrs of age since birth. ( parents aware to  offer more if infant not latching at the breasts)          3. Mom to pump with DEBP q 3 hrs for .              All questions answered at the end of the visit.     Maternal Data    Feeding Mother's Current Feeding Choice: Breast Milk and Formula  LATCH Score Latch: Repeated attempts needed to sustain latch, nipple held in mouth throughout feeding, stimulation needed to elicit sucking reflex.  Audible Swallowing: Spontaneous and intermittent (when pushing formula, slow intermittent at breasts only)  Type of Nipple: Flat  Comfort (Breast/Nipple): Soft / non-tender  Hold (Positioning): Assistance needed to correctly position infant at breast and maintain latch.  LATCH Score: 7   Lactation Tools Discussed/Used Tools: 30F feeding tube / Syringe;Flanges;Shells;Pump Flange Size: 27 Breast pump type: Double-Electric Breast Pump Pump Education: Setup, frequency, and cleaning;Milk Storage Reason for Pumping: increase stimulation Pumping frequency: every 3 hrs for 15 minutes  Interventions Interventions: Breast feeding basics reviewed;Adjust position;Assisted with latch;Skin to skin;Position options;Education;Breast massage;Expressed milk;Breast compression;Hand express;Shells;DEBP;Support pillows  Discharge Discharge Education: Engorgement and breast care;Warning signs for feeding baby;Outpatient recommendation;Outpatient Epic message sent Pump: Personal WIC Program: No  Consult Status Consult Status: Follow-up Date: 05/03/21 Follow-up type: In-patient    Barby Colvard  Nicholson-Springer 05/02/2021, 12:08 PM

## 2021-05-02 NOTE — Discharge Summary (Signed)
Postpartum Discharge Summary  Patient Name: Lisa Mckay DOB: 09-14-1991 MRN: [id number]  Date of admission: 04/29/2021 Delivery date:04/30/2021  Delivering provider: Irene Pap E  Date of discharge: 05/02/2021  Admitting diagnosis: Pregnancy [Z34.90] Intrauterine pregnancy: [redacted]w[redacted]d    Secondary diagnosis:  Active Problems:   Pregnancy  Additional problems: GHTN    Discharge diagnosis: Term Pregnancy Delivered and Gestational Hypertension                                              Post partum procedures:none Augmentation: AROM, Pitocin and Cytotec Complications: None  Hospital course: Induction of Labor With Vaginal Delivery   30y.o. yo G1P1001 at 282w1das admitted to the hospital 04/29/2021 for induction of labor.  Indication for induction: Gestational hypertension.  Patient had an uncomplicated labor course as follows: Membrane Rupture Time/Date: 9:30 AM ,04/30/2021   Delivery Method:Vaginal, Spontaneous  Episiotomy: None  Lacerations:  Vaginal  Details of delivery can be found in separate delivery note.  Patient had a routine postpartum course. Patient is discharged home 05/02/21.  Newborn Data: Birth date:04/30/2021  Birth time:11:28 PM  Gender:Female  Living status:Living  Apgars:7 ,9  Weight:3541 g   Magnesium Sulfate received: No BMZ received: No Rhophylac:N/A MMR:N/A T-DaP:see office note Flu: N/A Transfusion:No  Physical exam  Vitals:   05/01/21 0647 05/01/21 1154 05/01/21 1708 05/02/21 0525  BP: 123/75 111/65 119/74 120/84  Pulse: 67 86 84 95  Resp: _0 Temp: 98.8 F (37.1 C) 99.2 F (37.3 C) 98.7 F (37.1 C) 98.1 F (36.7 C)  TempSrc: Oral Oral Oral Oral  SpO2:    97%  Weight:      Height:       General: alert, cooperative and no distress Lochia: appropriate Uterine  Fundus: firm DVT Evaluation: No evidence of DVT seen on physical exam. Labs: Lab Results  Component Value Date   WBC 15.3 (H) 05/01/2021   HGB 11.7 (L)  05/01/2021   HCT 34.8 (L) 05/01/2021   MCV 89.7 05/01/2021   PLT 185 05/01/2021   CMP Latest Ref Rng & Units 04/29/2021  Glucose 70 - 99 mg/dL 96  BUN 6 - 20 mg/dL 8  Creatinine 0.44 - 1.00 mg/dL 0.65  Sodium 135 - 145 mmol/L 137  Potassium 3.5 - 5.1 mmol/L 4.2  Chloride 98 - 111 mmol/L 106  CO2 22 - 32 mmol/L 20(L)  Calcium 8.9 - 10.3 mg/dL 8.6(L)  Total Protein 6.5 - 8.1 g/dL 5.6(L)  Total Bilirubin 0.3 - 1.2 mg/dL 0.6  Alkaline Phos 38 - 126 U/L 180(H)  AST 15 - 41 U/L 23  ALT 0 - 44 U/L 20   Edinburgh Score: Edinburgh Postnatal Depression Scale Screening Tool 05/01/2021  I have been able to laugh and see the funny side of things. 0  I have looked forward with enjoyment to things. 0  I have blamed myself unnecessarily when things went wrong. 1  I have been anxious or worried for no good reason. 1  I have felt scared or panicky for no good reason. 0  Things have been getting on top of me. 1  I have been so unhappy that I have had difficulty sleeping. 0  I have felt sad or miserable. 0  I have been so unhappy that I have been crying. 0  The thought of harming  myself has occurred to me. 0  Edinburgh Postnatal Depression Scale Total 3      After visit meds:  Allergies as of 05/02/2021   No Known Allergies     Medication List    STOP taking these medications   aspirin EC 81 MG tablet     TAKE these medications   prenatal multivitamin Tabs tablet Take 1 tablet by mouth daily at 12 noon.        Discharge home in stable condition Infant Feeding: Breast Infant Disposition:home with mother Discharge instruction: per After Visit Summary and Postpartum booklet. Activity: Advance as tolerated. Pelvic rest for 6 weeks.  Diet: routine diet Anticipated Birth Control: Unsure Postpartum Appointment:4 weeks Additional Postpartum F/U: Postpartum Depression checkup Future Appointments:No future appointments. Follow up Visit:  Follow-up Information    Rowland Lathe, MD Follow up in 4 week(s).   Specialty: Obstetrics and Gynecology Contact information: [address] [place] [address] [phone]                   05/02/2021 Allyn Kenner, DO

## 2021-10-02 IMAGING — US US MFM OB COMP +14 WKS
1 series · 13 of 28 positions shown · non-contrast
Comparison: none

[Series 1: us mfm ob comp +14 wks · 100 acquisitions, 13 frames shown]
[im 4/100]
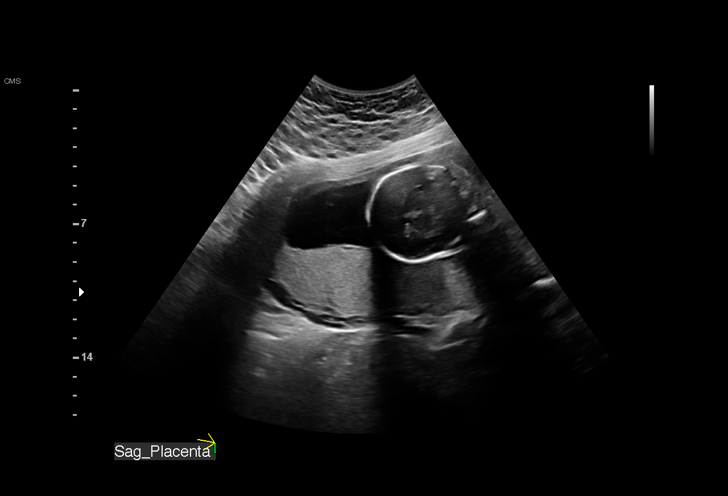
[im 12/100]
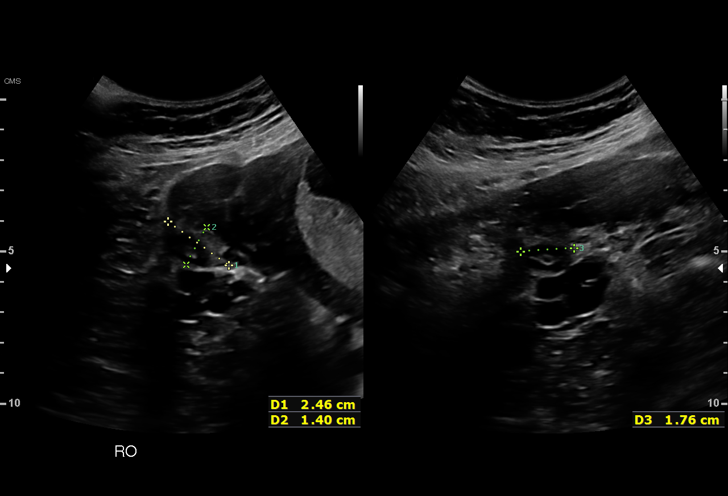
[im 19/100]
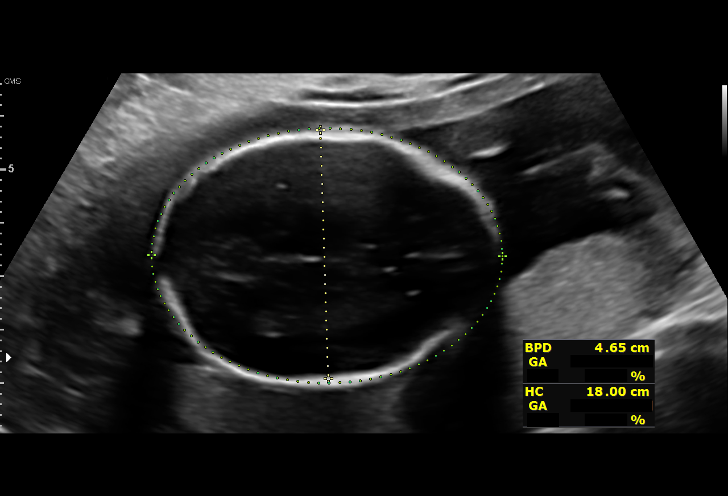
[im 26/100]
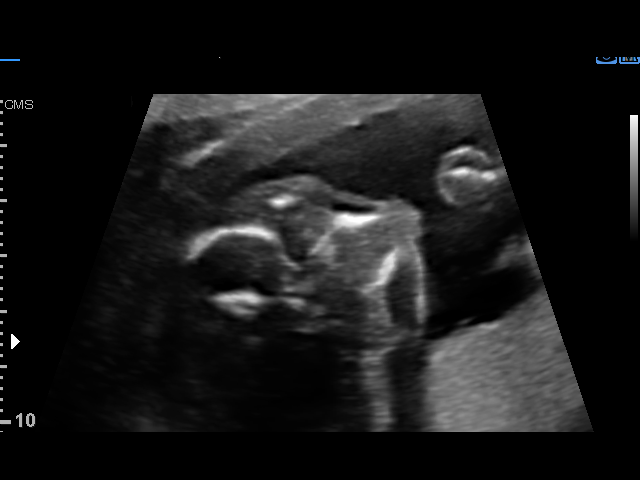
[im 34/100]
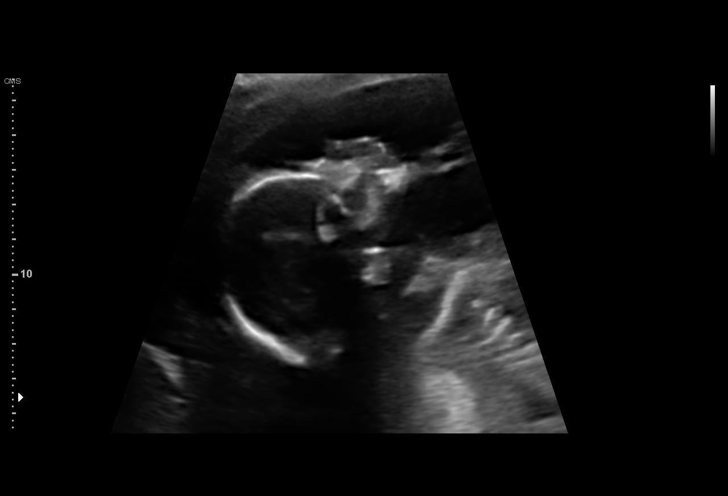
[im 41/100]
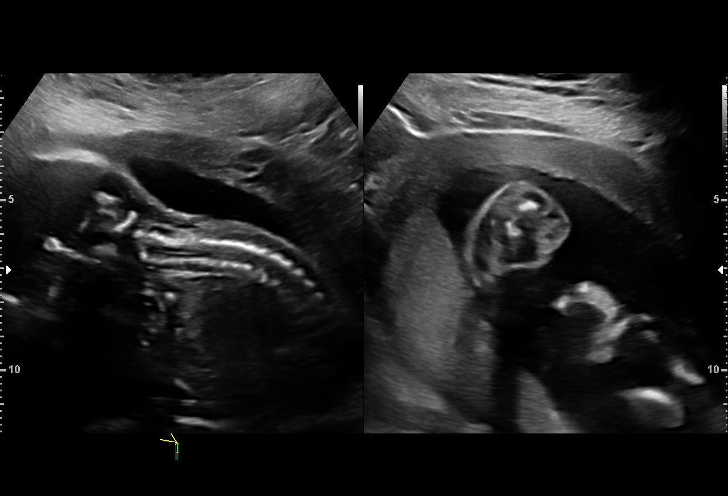
[im 52/100]
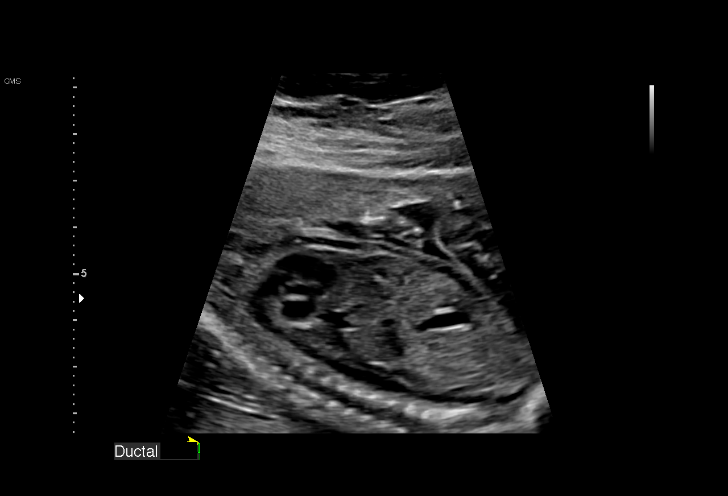
[im 59/100]
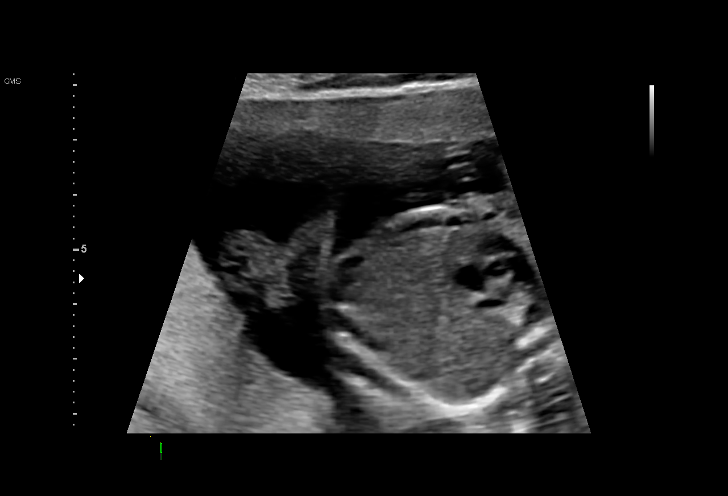
[im 67/100]
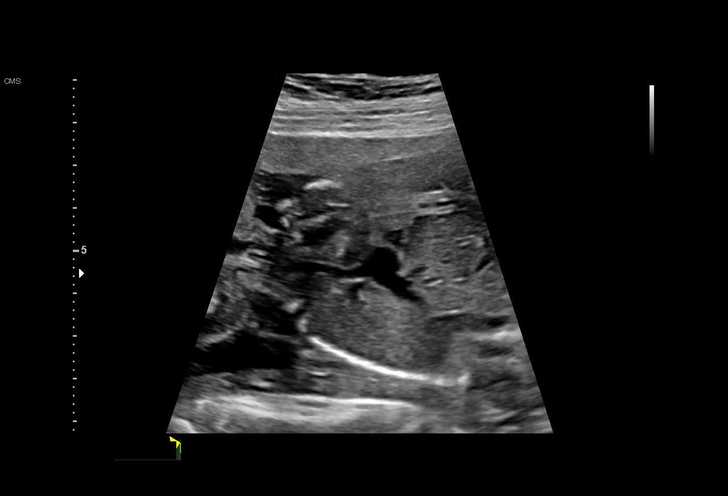
[im 74/100]
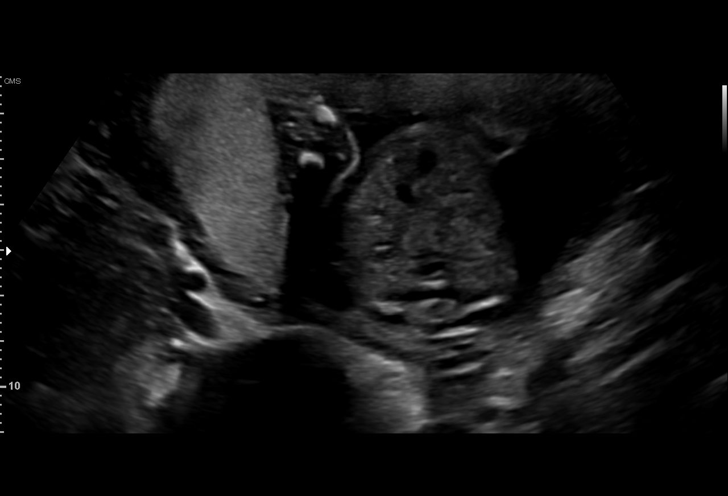
[im 81/100]
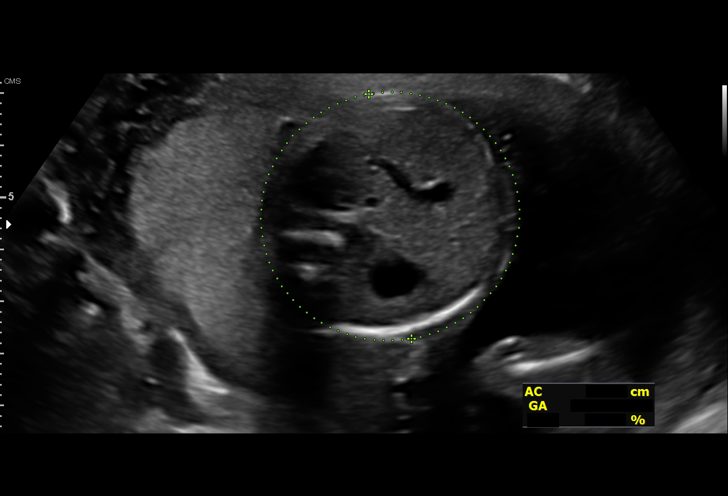
[im 89/100]
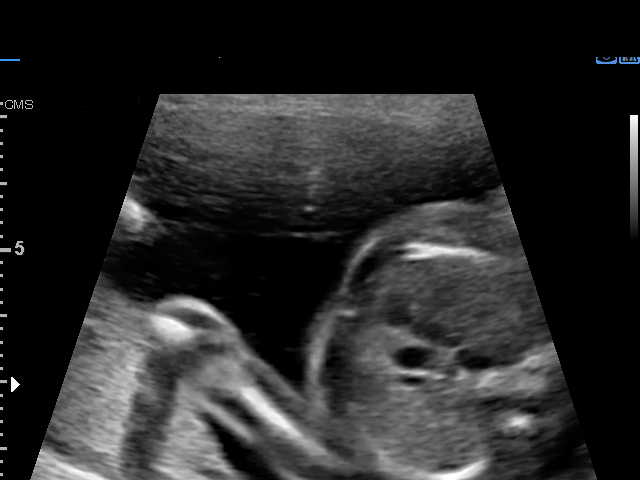
[im 96/100]
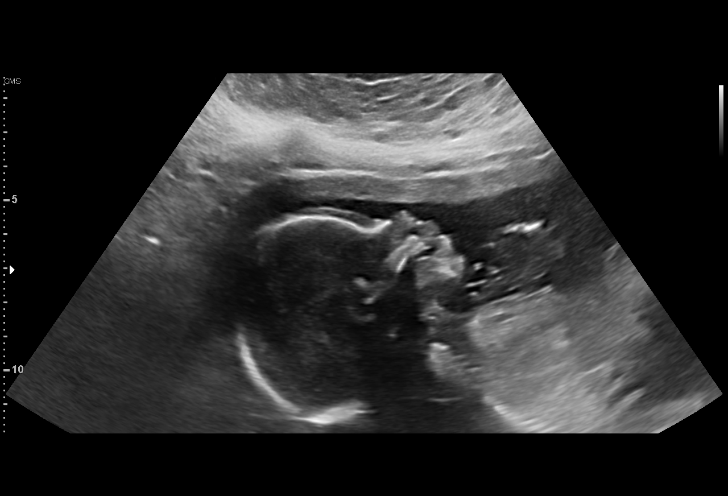

[13 of 28 positions shown; findings below may reference images not displayed]

BAMBUCAFE DO

 1  US MFM OB COMP + 14 WK                76805.01    SHAW NORMIL

Indications

 20 weeks gestation of pregnancy
 Antenatal screening for malformations
Fetal Evaluation

 Num Of Fetuses:         1
 Cardiac Activity:       Observed
 Presentation:           Breech
 Placenta:               Posterior
 P. Cord Insertion:      Visualized

 Amniotic Fluid
 AFI FV:      Within normal limits

                             Largest Pocket(cm)

Biometry

 BPD:      45.9  mm     G. Age:  19w 6d         38  %    CI:        68.15   %    70 - 86
                                                         FL/HC:      18.4   %    16.8 -
 HC:      177.8  mm     G. Age:  20w 2d         47  %    HC/AC:      1.17        1.09 -
 AC:      151.9  mm     G. Age:  20w 3d         53  %    FL/BPD:     71.2   %
 FL:       32.7  mm     G. Age:  20w 1d         45  %    FL/AC:      21.5   %    20 - 24
 HUM:      30.2  mm     G. Age:  20w 0d         47  %
 CER:      21.3  mm     G. Age:  20w 1d         73  %

 LV:        6.3  mm
 CM:          3  mm
 Est. FW:     345  gm    0 lb 12 oz      55  %
OB History

 Gravidity:    1         Term:   0        Prem:   0        SAB:   0
 TOP:          0       Ectopic:  0        Living: 0
Gestational Age

 LMP:           21w 5d        Date:  07/19/20                 EDD:   04/25/21
 U/S Today:     20w 1d                                        EDD:   05/06/21
 Best:          20w 1d     Det. By:  Previous Ultrasound      EDD:   05/06/21
Anatomy

 Cranium:               Appears normal         LVOT:                   Appears normal
 Cavum:                 Appears normal         Aortic Arch:            Appears normal
 Ventricles:            Appears normal         Ductal Arch:            Appears normal
 Choroid Plexus:        Appears normal         Diaphragm:              Appears normal
 Cerebellum:            Appears normal         Stomach:                Appears normal, left
                                                                       sided
 Posterior Fossa:       Appears normal         Abdomen:                Appears normal
 Nuchal Fold:           Appears normal         Abdominal Wall:         Appears nml (cord
                                                                       insert, abd wall)
 Face:                  Appears normal         Cord Vessels:           Appears normal (3
                        (orbits and profile)                           vessel cord)
 Lips:                  Appears normal         Kidneys:                Appear normal
 Palate:                Appears normal         Bladder:                Appears normal
 Thoracic:              Appears normal         Spine:                  Not well visualized
 Heart:                 Appears normal         Upper Extremities:      Appears normal
                        (4CH, axis, and
                        situs)
 RVOT:                  Appears normal         Lower Extremities:      Appears normal

 Other:  Heels/feet and open hands/5th digits visualized. Lenses visualized.
         Nasal bone visualized. Fetus appears to be female. Technically
         difficult due to fetal position.
Cervix Uterus Adnexa

 Cervix
 Length:           3.21  cm.
 Normal appearance by transabdominal scan.

 Uterus
 No abnormality visualized.

 Right Ovary
 Within normal limits.

 Left Ovary
 Within normal limits.

 Cul De Sac
 No free fluid seen.

 Adnexa
 No abnormality visualized.
Impression

 Single intrauterine pregnancy here for a detailed anatomy
 Normal anatomy with measurements consistent with dates
 There is good fetal movement and amniotic fluid volume
 Suboptimal views of the fetal anatomy were obtained
 secondary to fetal position.
 Low risk NIPS is reported.
Recommendations

 Follow up growth in 4-6 weeks.

## 2023-10-11 ENCOUNTER — Encounter: Payer: Self-pay | Admitting: Emergency Medicine

## 2023-10-11 ENCOUNTER — Other Ambulatory Visit: Payer: Self-pay

## 2023-10-11 ENCOUNTER — Emergency Department
Admission: EM | Admit: 2023-10-11 | Discharge: 2023-10-11 | Disposition: A | Discharge status: Home / Self Care | Payer: Commercial Managed Care - PPO | Attending: Emergency Medicine | Admitting: Emergency Medicine

## 2023-10-11 DIAGNOSIS — F419 Anxiety disorder, unspecified: Secondary | ICD-10-CM

## 2023-10-11 DIAGNOSIS — R079 Chest pain, unspecified: Secondary | ICD-10-CM | POA: Insufficient documentation

## 2023-10-11 DIAGNOSIS — R002 Palpitations: Secondary | ICD-10-CM | POA: Diagnosis present

## 2023-10-11 LAB — COMPREHENSIVE METABOLIC PANEL
ALT: 19 U/L (ref 0–44)
AST: 18 U/L (ref 15–41)
Albumin: 3.4 g/dL — ABNORMAL LOW (ref 3.5–5.0)
Alkaline Phosphatase: 47 U/L (ref 38–126)
Anion gap: 7 (ref 5–15)
BUN: 15 mg/dL (ref 6–20)
CO2: 23 mmol/L (ref 22–32)
Calcium: 8.5 mg/dL — ABNORMAL LOW (ref 8.9–10.3)
Chloride: 105 mmol/L (ref 98–111)
Creatinine, Ser: 0.75 mg/dL (ref 0.44–1.00)
GFR, Estimated: >60 mL/min (ref >60)
Glucose, Bld: 149 mg/dL — ABNORMAL HIGH (ref 70–99)
Potassium: 3.6 mmol/L (ref 3.5–5.1)
Sodium: 135 mmol/L (ref 135–145)
Total Bilirubin: 0.6 mg/dL (ref 0.3–1.2)
Total Protein: 7.1 g/dL (ref 6.5–8.1)

## 2023-10-11 LAB — CBC
HCT: 42 % (ref 36.0–46.0)
Hemoglobin: 14.8 g/dL (ref 12.0–15.0)
MCH: 31.2 pg (ref 26.0–34.0)
MCHC: 35.2 g/dL (ref 30.0–36.0)
MCV: 88.4 fL (ref 80.0–100.0)
Platelets: 227 10*3/uL (ref 150–400)
RBC: 4.75 MIL/uL (ref 3.87–5.11)
RDW: 11.7 % (ref 11.5–15.5)
WBC: 10.2 10*3/uL (ref 4.0–10.5)
nRBC: 0 % (ref 0.0–0.2)

## 2023-10-11 LAB — POC URINE PREG, ED: Preg Test, Ur: NEGATIVE

## 2023-10-11 LAB — TROPONIN I (HIGH SENSITIVITY): Troponin I (High Sensitivity): <2 ng/L (ref <18)

## 2023-10-11 MED ORDER — LORAZEPAM 1 MG PO TABS
1.0000 mg | ORAL_TABLET | Freq: Once | ORAL | Status: AC
  Administered 2023-10-11: 1 mg via ORAL
  Filled 2023-10-11: qty 1

## 2023-10-11 NOTE — ED Provider Notes (Signed)
[facility] Provider Note    Event Date/Time   First MD Initiated Contact with Patient 10/11/23 1111     (approximate)  History   Chief Complaint: Chest Pain  HPI  Lisa Mckay is a 32 y.o. female with a past medical history of anxiety who presents to the emergency department for heart palpitations and chest tightness.  According to the patient since yesterday she has been experiencing some palpitations.  States it was keeping her from sleeping some last night.  This morning she took an Adderall tablet which she does not take very often but was very tired today.  Patient states since taking the Adderall tablet she feels like her symptoms has worsened where she is experiencing a racing sensation in her chest as well as some intermittent chest tightness.  Patient describes as "mini panic attacks."  Patient has a history of anxiety and no longer takes any anxiety medication.  Patient states since coming to the emergency department the palpitations have calm down and she is not having any chest tightness or pain currently.  Patient does not wish for anything for anxiety at the moment.  Denies any cardiac history.  Physical Exam   Triage Vital Signs: ED Triage Vitals [10/11/23 1112]  Encounter Vitals Group     BP      Systolic BP Percentile      Diastolic BP Percentile      Pulse      Resp      Temp      Temp src      SpO2      Weight 220 lb 14.4 oz (100.2 kg)     Height 5\' 5"  (1.651 m)     Head Circumference      Peak Flow      Pain Score 1     Pain Loc      Pain Education      Exclude from Growth Chart     Most recent vital signs: There were no vitals filed for this visit.  General: Awake, no distress.  CV:  Good peripheral perfusion.  Regular rate and rhythm around 90 bpm. Resp:  Normal effort.  Equal breath sounds bilaterally.  Abd:  No distention.  Soft, nontender.  No rebound or guarding.  ED Results / Procedures / Treatments   EKG  EKG  viewed and interpreted by myself shows a normal sinus rhythm at 95 bpm with a narrow QRS, normal axis, normal intervals, no concerning ST changes.  MEDICATIONS ORDERED IN ED: Medications - No data to display   IMPRESSION / MDM / ASSESSMENT AND PLAN / ED COURSE  I reviewed the triage vital signs and the nursing notes.  Patient's presentation is most consistent with acute presentation with potential threat to life or bodily function.  Patient presents to the emergency department for palpitations/heart racing sensation started last night but worsened today after taking Adderall.  Patient states a history of anxiety and panic disorder to which this feels similar.  Patient states her symptoms have begun to calm down since arriving to the emergency department.  EKG reassuring.  Will obtain labs as a precaution and continue to closely monitor the patient.  Patient's labs are reassuring CBC is normal, chemistry is reassuring and troponin is negative.  Patient states she is feeling much better.  We will discharge the patient home with continued supportive care at home avoiding Adderall and following up with her PCP.  FINAL CLINICAL IMPRESSION(S) / ED  DIAGNOSES   Palpitations Anxiety    Note:  This document was prepared using Dragon voice recognition software and may include unintentional dictation errors.   Minna Antis, MD 10/11/23 1207

## 2023-10-11 NOTE — ED Triage Notes (Signed)
Presents via EMS from [employer] she developed some chest tightness since last pm   States became worse this am

## 2023-10-18 ENCOUNTER — Encounter (HOSPITAL_COMMUNITY): Payer: Self-pay

## 2023-10-18 ENCOUNTER — Other Ambulatory Visit: Payer: Self-pay

## 2023-10-18 ENCOUNTER — Emergency Department (HOSPITAL_COMMUNITY)
Admission: EM | Admit: 2023-10-18 | Discharge: 2023-10-18 | Disposition: A | Discharge status: Home / Self Care | Payer: Commercial Managed Care - PPO | Attending: Emergency Medicine | Admitting: Emergency Medicine

## 2023-10-18 ENCOUNTER — Emergency Department (HOSPITAL_COMMUNITY): Payer: Commercial Managed Care - PPO

## 2023-10-18 DIAGNOSIS — K219 Gastro-esophageal reflux disease without esophagitis: Secondary | ICD-10-CM | POA: Insufficient documentation

## 2023-10-18 DIAGNOSIS — R079 Chest pain, unspecified: Secondary | ICD-10-CM | POA: Diagnosis present

## 2023-10-18 LAB — LIPASE, BLOOD: Lipase: 38 U/L (ref 11–51)

## 2023-10-18 LAB — HCG, SERUM, QUALITATIVE: Preg, Serum: NEGATIVE

## 2023-10-18 LAB — CBC
HCT: 45.5 % (ref 36.0–46.0)
Hemoglobin: 15.9 g/dL — ABNORMAL HIGH (ref 12.0–15.0)
MCH: 31.1 pg (ref 26.0–34.0)
MCHC: 34.9 g/dL (ref 30.0–36.0)
MCV: 88.9 fL (ref 80.0–100.0)
Platelets: 275 10*3/uL (ref 150–400)
RBC: 5.12 MIL/uL — ABNORMAL HIGH (ref 3.87–5.11)
RDW: 11.7 % (ref 11.5–15.5)
WBC: 7.4 10*3/uL (ref 4.0–10.5)
nRBC: 0 % (ref 0.0–0.2)

## 2023-10-18 LAB — URINALYSIS, ROUTINE W REFLEX MICROSCOPIC
Bilirubin Urine: NEGATIVE
Glucose, UA: NEGATIVE mg/dL
Ketones, ur: NEGATIVE mg/dL
Nitrite: NEGATIVE
Protein, ur: NEGATIVE mg/dL
Specific Gravity, Urine: 1.019 (ref 1.005–1.030)
pH: 6 (ref 5.0–8.0)

## 2023-10-18 LAB — COMPREHENSIVE METABOLIC PANEL
ALT: 27 U/L (ref 0–44)
AST: 21 U/L (ref 15–41)
Albumin: 3.5 g/dL (ref 3.5–5.0)
Alkaline Phosphatase: 54 U/L (ref 38–126)
Anion gap: 9 (ref 5–15)
BUN: 7 mg/dL (ref 6–20)
CO2: 27 mmol/L (ref 22–32)
Calcium: 9.5 mg/dL (ref 8.9–10.3)
Chloride: 102 mmol/L (ref 98–111)
Creatinine, Ser: 0.79 mg/dL (ref 0.44–1.00)
GFR, Estimated: >60 mL/min (ref >60)
Glucose, Bld: 137 mg/dL — ABNORMAL HIGH (ref 70–99)
Potassium: 3.7 mmol/L (ref 3.5–5.1)
Sodium: 138 mmol/L (ref 135–145)
Total Bilirubin: 0.6 mg/dL (ref 0.3–1.2)
Total Protein: 7.3 g/dL (ref 6.5–8.1)

## 2023-10-18 MED ORDER — FAMOTIDINE 20 MG PO TABS: 20.0000 mg | ORAL_TABLET | Freq: Two times a day (BID) | ORAL | 0 refills | Status: AC

## 2023-10-18 MED ORDER — LIDOCAINE VISCOUS HCL 2 % MT SOLN
15.0000 mL | Freq: Once | OROMUCOSAL | Status: AC
  Administered 2023-10-18: 15 mL via ORAL
  Filled 2023-10-18: qty 15

## 2023-10-18 MED ORDER — ALUM & MAG HYDROXIDE-SIMETH 200-200-20 MG/5ML PO SUSP
30.0000 mL | Freq: Once | ORAL | Status: AC
  Administered 2023-10-18: 30 mL via ORAL
  Filled 2023-10-18: qty 30

## 2023-10-18 NOTE — ED Notes (Signed)
Patient verbalizes understanding of discharge instructions. Opportunity for questioning and answers were provided. Armband removed by staff, pt discharged from ED. Ambulated out to lobby with husband

## 2023-10-18 NOTE — Discharge Instructions (Addendum)
I am suspicious that your chest pain is likely secondary to acid reflux, I started you on an antacid, to help with this, please take it twice a day, for this.  Please make the changes of your food choices, accordingly.  Return to the ER if you have worsening chest pain, shortness of breath, or abdominal pain.

## 2023-10-18 NOTE — ED Triage Notes (Signed)
PT arrived POV from home c/o nausea and vomiting on and off all week. Pt denies any belly pain but cannot take the N/V anymore and did not want to spend another night next to the toilet.

## 2023-10-18 NOTE — ED Provider Notes (Signed)
[place] EMERGENCY DEPARTMENT AT [facility] Provider Note   CSN: [id number] Arrival date & time: 10/18/23  0113     History  Chief Complaint  Patient presents with   Nausea    Lisa Mckay is a 32 y.o. female, no pertinent past medical history, who presents to the ED secondary to chest pain, has been going on for the last week.  She states that it is worse with eating, and it feels like burning in her chest, with associated nausea and regurgitation.  She denies any kind of abdominal pain, diarrhea, has been having normal bowel movements.  States that she has not tried anything for this.  Denies any shortness of breath  Drinks socially.      Home Medications Prior to Admission medications   Medication Sig Start Date End Date Taking? Authorizing Provider  escitalopram (LEXAPRO) 10 MG tablet Take 10 mg by mouth daily. 10/12/23  Yes [provider]  famotidine (PEPCID) 20 MG tablet Take 1 tablet (20 mg total) by mouth 2 (two) times daily. 10/18/23  Yes Yazmyne Sara L, PA  SRONYX 0.1-20 MG-MCG tablet Take 1 tablet by mouth daily. 09/19/23  Yes [provider]      Allergies    Patient has no known allergies.    Review of Systems   Review of Systems  Cardiovascular:  Positive for chest pain.  Gastrointestinal:  Negative for abdominal pain and constipation.    Physical Exam Updated Vital Signs BP 118/68   Pulse 72   Temp 98.4 F (36.9 C) (Oral)   Resp 13   Ht 5\' 5"  (1.651 m)   Wt 77.1 kg   LMP 09/27/2023 (Approximate)   SpO2 99%   BMI 28.29 kg/m  Physical Exam Vitals and nursing note reviewed.  Constitutional:      General: She is not in acute distress.    Appearance: She is well-developed.  HENT:     Head: Normocephalic and atraumatic.  Eyes:     Conjunctiva/sclera: Conjunctivae normal.  Cardiovascular:     Rate and Rhythm: Normal rate and regular rhythm.     Heart sounds: No murmur heard. Pulmonary:     Effort: Pulmonary effort  is normal. No respiratory distress.     Breath sounds: Normal breath sounds.  Abdominal:     Palpations: Abdomen is soft.     Tenderness: There is no abdominal tenderness.  Musculoskeletal:        General: No swelling.     Cervical back: Neck supple.  Skin:    General: Skin is warm and dry.     Capillary Refill: Capillary refill takes less than 2 seconds.  Neurological:     Mental Status: She is alert.  Psychiatric:        Mood and Affect: Mood normal.     ED Results / Procedures / Treatments   Labs (all labs ordered are listed, but only abnormal results are displayed) Labs Reviewed  COMPREHENSIVE METABOLIC PANEL - Abnormal; Notable for the following components:      Result Value   Glucose, Bld 137 (*)    All other components within normal limits  CBC - Abnormal; Notable for the following components:   RBC 5.12 (*)    Hemoglobin 15.9 (*)    All other components within normal limits  URINALYSIS, ROUTINE W REFLEX MICROSCOPIC - Abnormal; Notable for the following components:   APPearance HAZY (*)    Hgb urine dipstick LARGE (*)    Leukocytes,Ua  Alizee Maple (*)    Bacteria, UA FEW (*)    All other components within normal limits  LIPASE, BLOOD  HCG, SERUM, QUALITATIVE    EKG None  Radiology DG Chest 2 View  Result Date: 10/18/2023 CLINICAL DATA:  Epigastric pain. EXAM: CHEST - 2 VIEW COMPARISON:  None Available. FINDINGS: Normal heart size and mediastinal contours. No acute infiltrate or edema. No effusion or pneumothorax. No acute osseous findings. Artifact from EKG leads. IMPRESSION: Normal chest. Electronically Signed   By: Tiburcio Pea M.D.   On: 10/18/2023 04:20    Procedures Procedures    Medications Ordered in ED Medications  alum & mag hydroxide-simeth (MAALOX/MYLANTA) 200-200-20 MG/5ML suspension 30 mL (30 mLs Oral Given 10/18/23 0424)    And  lidocaine (XYLOCAINE) 2 % viscous mouth solution 15 mL (15 mLs Oral Given 10/18/23 0424)    ED Course/ Medical  Decision Making/ A&P                                 Medical Decision Making Patient is a 32 year old female, no pertinent past medical history, here for chest pain, it has been going on for the last week.  Worse with eating, associated nausea and some regurg.  We will obtain a chest x-ray, EKG, blood work for further evaluation.  She is well-appearing on exam.  I am suspicious for gastritis versus esophagitis, secondary to her pain.  Will give her some Maalox, and see if there is any improvement  Amount and/or Complexity of Data Reviewed Labs: ordered.    Details: Unremarkable, lipase within normal limits Radiology: ordered.    Details: Chest x-ray clear ECG/medicine tests:     Details: Normal sinus rhythm Discussion of management or test interpretation with external provider(s): Discussed with patient, chest x-ray is clear, normal sinus rhythm EKG, improved with Maalox, I believe that her pain is likely secondary to esophagitis,/GERD, we started her on famotidine, and will have her follow-up with her primary care doctor.  Discussed return precautions and she voiced understanding.  Given information for eating plan.  Chest pain is very atypical  Risk OTC drugs. Prescription drug management.    Final Clinical Impression(s) / ED Diagnoses Final diagnoses:  Gastroesophageal reflux disease, unspecified whether esophagitis present  Chest pain, unspecified type    Rx / DC Orders ED Discharge Orders          Ordered    famotidine (PEPCID) 20 MG tablet  2 times daily        10/18/23 0453              Davey Bergsma, Harley [place], PA 10/18/23 0609    Melene Plan, DO 10/18/23 [phone]
